# Patient Record
Sex: Male | Born: 1990 | Race: Asian | Hispanic: No | Marital: Single | Smoking: Never smoker
Health system: Southern US, Community
[De-identification: ages and names within clinical notes are randomized; demographics above are authoritative.]

---
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
## 2010-09-13 ENCOUNTER — Encounter: Payer: Self-pay | Admitting: *Deleted

## 2011-01-08 NOTE — H&P (Signed)
NAME:  MERLEN, GURRY NO.:  [id number]   MEDICAL RECORD NO.:  [id number]          PATIENT TYPE:  AMB   LOCATION:  SDS                          FACILITY:  [facility]   PHYSICIAN:  Hilda Lias, M.D.   DATE OF BIRTH:  March 22, 1991   DATE OF ADMISSION:  04/08/2006  DATE OF DISCHARGE:                                HISTORY & PHYSICAL   HISTORY:  Mr. Paolini is a 20 year old gentleman who was referred to me by the  pediatrician, Dr. Georg Ruddle.  He came to me and telling me that when he was  born he noticed a lesion in the second area which had been getting bigger in  the past few years.  He was sent to [place] for evaluation.  We did an MRI of the  lumbar and  sacral area with and without contrast, which was negative.  There was no connection whatsoever between the mass and the lumbar spine or  the sacral area.  Never-the-less, because it is getting bigger and is  uncomfortable for the patient, we bring him in today for surgery.   PAST MEDICAL HISTORY:  He has a history of a heart murmur.   SOCIAL HISTORY:  Negative.   MEDICATIONS:  Levoxyl.   FAMILY HISTORY:  Negative.   PHYSICAL EXAMINATION:  HEENT:  Normal.  NECK:  Normal.  LUNGS:  Clear.  HEART:  Sounds normal.  ABDOMEN:  Normal.  EXTREMITIES:  Normal pulses.  NEUROLOGIC:  Completely normal.  Inspection of the lumbar spine is negative.  BACK:  In the sacrum right in the inter-gluteal area on the right side there  is a large cyst which is not painful.  It is hard.  There are no bony  abnormalities.  There is no hair in that area.  There is no connection  whatsoever with the anus.  There is no opening whatsoever.   The x-ray of the lumbar spine as well as the MRI is negative.   CLINICAL IMPRESSION:  Rule out dermoid cyst or pilonidal cyst.   RECOMMENDATIONS:  The plan is to go sometime tomorrow for further evaluation  or surgery.  The father wants [place] to proceed with surgery.  The procedure  will be a resection of  the lesion.  The risks of recurrence, infection or  the possibility of a CSF leak.  In the procedure we are going to rule out  the possibility of a fistula.          ______________________________  Hilda Lias, M.D.    EB/MEDQ  D:  04/08/2006  T:  04/08/2006  Job:  [id number]

## 2011-01-08 NOTE — Op Note (Signed)
NAME:  Cesar Melendez, Cesar Melendez NO.:  [id number]   MEDICAL RECORD NO.:  [id number]          PATIENT TYPE:  AMB   LOCATION:  SDS                          FACILITY:  [facility]   PHYSICIAN:  Hilda Lias, M.D.   DATE OF BIRTH:  Mar 30, 1991   DATE OF PROCEDURE:  04/08/2006  DATE OF DISCHARGE:                                 OPERATIVE REPORT   SURGEON:  Hilda Lias, M.D.   ASSISTANT:  Cristi Loron, M.D.   PREOPERATIVE DIAGNOSIS:  Sacral mass, rule out lipoma.   POSTOPERATIVE DIAGNOSIS:  Keloid of the sacral area.   PROCEDURE:  Resection of the sacral keloid.   CLINICAL HISTORY:  Mr. Eichelberger is a 20 year old, who has been complaining of a  mass in the intergluteal area for several years, which according to the  family is getting worse lately.  The patient had difficulty sitting.  We did  an MRI of the lumbosacral area and it was negative.  There was no connection  whatsoever between the skin and the spine.  Basically, the mass was solid,  with some erythema on the skin.  There was no evidence of any hair or  drainage in the area.  Because of this findings, the patient and the family  wanted to proceed with resection.   PROCEDURE:  The patient was taken to the operating room and the area was  prepped with DuraPrep.  The mass was about an inch above the anus.  Incision  in the midline was made and dissection was going laterally.  Indeed, what we  found was the mass was approximately 1 x 1 1/2 inches, really hard, attached  to the skin.  Dissection was carried down all the way down to the sacrum.  We investigated the sacrum, and there was no evidence of any fistula.  There  was no connection between the fascia and the tumor itself.  We went  laterally and resected the whole area.  The normal paragluteal fat tissue  was left behind.  At the end, we had a good resection.  The mass was sent to  the laboratory and came back as a keloid.  Finally, the area was irrigated.  Hemostasis was also completed with bipolar.  Then, the area was pulled back  together using 0 Vicryl and 3-0 Vicryl.  There was a good approximation of  the edges.  Steri-Strips were applied to the area.  The patient is going to  go to the PACU, and I will see him later on this afternoon to see if he can  go home.           ______________________________  Hilda Lias, M.D.     EB/MEDQ  D:  04/08/2006  T:  04/08/2006  Job:  [id number]

## 2015-07-30 ENCOUNTER — Ambulatory Visit (INDEPENDENT_AMBULATORY_CARE_PROVIDER_SITE_OTHER): Payer: Managed Care, Other (non HMO) | Admitting: Internal Medicine

## 2015-07-30 ENCOUNTER — Encounter: Payer: Self-pay | Admitting: Internal Medicine

## 2015-07-30 VITALS — BP 102/60 | HR 81 | Temp 98.2°F | Resp 12 | Ht 65.0 in | Wt 141.8 lb

## 2015-07-30 DIAGNOSIS — E0500 Thyrotoxicosis with diffuse goiter without thyrotoxic crisis or storm: Secondary | ICD-10-CM | POA: Insufficient documentation

## 2015-07-30 DIAGNOSIS — R946 Abnormal results of thyroid function studies: Secondary | ICD-10-CM

## 2015-07-30 DIAGNOSIS — R7989 Other specified abnormal findings of blood chemistry: Secondary | ICD-10-CM

## 2015-07-30 DIAGNOSIS — E031 Congenital hypothyroidism without goiter: Secondary | ICD-10-CM

## 2015-07-30 LAB — T3, FREE: T3, Free: 3.7 pg/mL (ref 2.3–4.2)

## 2015-07-30 LAB — T4, FREE: FREE T4: 0.8 ng/dL (ref 0.60–1.60)

## 2015-07-30 LAB — TSH: TSH: 0.04 u[IU]/mL — ABNORMAL LOW (ref 0.35–4.50)

## 2015-07-30 NOTE — Patient Instructions (Signed)
Please stop at the lab.  If the thyroid tests are abnormal, we may need a thyroid uptake and scan.  Please come back for a follow-up appointment in 6 months.  Hyperthyroidism Hyperthyroidism is when the thyroid is too active (overactive). Your thyroid is a large gland that is located in your neck. The thyroid helps to control how your body uses food (metabolism). When your thyroid is overactive, it produces too much of a hormone called thyroxine.  CAUSES Causes of hyperthyroidism may include:  Graves disease. This is when your immune system attacks the thyroid gland. This is the most common cause.  Inflammation of the thyroid gland.  Tumor in the thyroid gland or somewhere else.  Excessive use of thyroid medicines, including:  Prescription thyroid supplement.  Herbal supplements that mimic thyroid hormones.  Solid or fluid-filled lumps within your thyroid gland (thyroid nodules).  Excessive ingestion of iodine. RISK FACTORS  Being male.  Having a family history of thyroid conditions. SIGNS AND SYMPTOMS Signs and symptoms of hyperthyroidism may include:  Nervousness.  Inability to tolerate heat.  Unexplained weight loss.  Diarrhea.  Change in the texture of hair or skin.  Heart skipping beats or making extra beats.  Rapid heart rate.  Loss of menstruation.  Shaky hands.  Fatigue.  Restlessness.  Increased appetite.  Sleep problems.  Enlarged thyroid gland or nodules. DIAGNOSIS  Diagnosis of hyperthyroidism may include:  Medical history and physical exam.  Blood tests.  Ultrasound tests. TREATMENT Treatment may include:  Medicines to control your thyroid.  Surgery to remove your thyroid.  Radiation therapy. HOME CARE INSTRUCTIONS   Take medicines only as directed by your health care provider.  Do not use any tobacco products, including cigarettes, chewing tobacco, or electronic cigarettes. If you need help quitting, ask your health care  provider.  Do not exercise or do physical activity until your health care provider approves.  Keep all follow-up appointments as directed by your health care provider. This is important. SEEK MEDICAL CARE IF:  Your symptoms do not get better with treatment.  You have fever.  You are taking thyroid replacement medicine and you:  Have depression.  Feel mentally and physically slow.  Have weight gain. SEEK IMMEDIATE MEDICAL CARE IF:   You have decreased alertness or a change in your awareness.  You have abdominal pain.  You feel dizzy.  You have a rapid heartbeat.  You have an irregular heartbeat.   This information is not intended to replace advice given to you by your health care provider. Make sure you discuss any questions you have with your health care provider.   Document Released: 08/09/2005 Document Revised: 08/30/2014 Document Reviewed: 12/25/2013 Elsevier Interactive Patient Education [employer]2016 Elsevier Inc.

## 2015-07-30 NOTE — Progress Notes (Addendum)
Patient ID: Cesar Melendez, male   DOB: April 21, 1991, 24 y.o.   MRN: [id number]   HPI  Cesar Melendez is a 24 y.o.-year-old male, referred by his PCP, Dr. Leodis Sias, for management of hypothyroidism, with recent tests showing hyperthyroidism even after he stopped thyroid hormone supplementation. In the past, he saw his pediatrician, Dr Samuel Bouche, who retired since. He is here accompanied by his father, who provides part of the hx.  Pt. has been dx with hypothyroidism at birth;  She was started on levothyroxine >> 50 and 75 mcg daily, however recently, his TSH started to decrease this by decreasing the dose of levothyroxine. His levothyroxine was stopped completely in 03/2015.  I reviewed pt's thyroid tests: 06/27/2015: TSH ? (records pending), TT4 10.1 (4.5-12), T3 uptake 31 (24-39%), fTI 3.1 (1.2-4.9) 04/22/2015: TSH <0.01 10/04/2014:  TSH 0.02 04/03/2014: TSH 0.56 02/16/2013: TSH <0.01  (0.34-4.5)  Of note, patient had an MRI of the brain with and without contrast on 04/02/2009, that returned normal.  Pt denies: - weight gain/loss - palpitations - tremors - insomnia - SOB - fatigue - heat/cold intolerance - anxiety/depression - diarrhea/constipation - moist/dry skin - hair loss  Pt denies feeling nodules in neck, hoarseness, dysphagia/odynophagia, SOB with lying down.  She has no FH of thyroid disorders No FH of thyroid cancer.  No h/o radiation tx to head or neck. No recent use of iodine supplements. No steroids. No Biotin.   Pt does not have other PMH.  ROS: Constitutional: no weight gain/loss, no fatigue, no subjective hyperthermia/hypothermia Eyes: no blurry vision, no xerophthalmia ENT: no sore throat, no nodules palpated in throat, no dysphagia/odynophagia, no hoarseness Cardiovascular: no CP/SOB/palpitations/leg swelling Respiratory: no cough/SOB Gastrointestinal: no N/V/D/C Musculoskeletal: no muscle/joint aches Skin: no rashes Neurological: no  tremors/numbness/tingling/dizziness Psychiatric: no depression/anxiety  No past medical history other than hypothyroidism. He had ear tubes as a child. Has a h/o pilonidal cyst.  No past surgical history.   Social History   Social History  . Marital Status: Single    Spouse Name: N/A  . Number of Children: 0   Occupational History  . student   Social History Main Topics  . Smoking status: Never Smoker   . Smokeless tobacco: Not on file  . Alcohol Use: No  . Drug Use: No   No current outpatient prescriptions on file.  No Known Allergies   FH:  - DM in [place], GF - HTN in [place], GF, father  PE: BP 102/60 mmHg  Pulse 81  Temp(Src) 98.2 F (36.8 C) (Oral)  Resp 12  Ht  (1.651 m)  Wt 141 lb 12.8 oz (64.32 kg)  BMI 23.60 kg/m2  SpO2 97% Wt Readings from Last 3 Encounters:  07/30/15 141 lb 12.8 oz (64.32 kg)  05/12/09 123 lb 6.4 oz (55.974 kg) (8 %*, Z = -1.39)  04/17/09 122 lb (55.339 kg) (7 %*, Z = -1.46)   * Growth percentiles are based on CDC 2-20 Years data.   Constitutional: normal weight, in NAD Eyes: PERRLA, EOMI, no exophthalmos ENT: moist mucous membranes, no thyromegaly, no cervical lymphadenopathy Cardiovascular: RRR, No MRG Respiratory: CTA B Gastrointestinal: abdomen soft, NT, ND, BS+ Musculoskeletal: no deformities, strength intact in all 4 Skin: moist, warm, no rashes Neurological: no tremor with outstretched hands, DTR normal in all 4  ASSESSMENT: 1. H/o congenital Hypothyroidism  2. Recently, suppressed TSH  PLAN:  1. And 2. Patient with congenital hypothyroidism, on levothyroxine therapy for a long time (50-75 mcg daily) until  2014 when his TSH started to become suppressed. His LT4 dose was gradually decreased until it was stopped in 03/2015.  After stopping the thyroid hormones, he had a repeat set of thyroid tests by PCP, but I only have a total T4,  FTI and T3 uptake that were normal, but no associated TSH... - My suspicion is that his  hypothyroidism resolved and he may not require thyroid hormones as an adult. This is a possible scenario, as some causes of congenital or childhood hypothyroidism may be temporary. - He appears euthyroid, and does not relate past hyperthyroid sxs. (weight loss, heat intolerance, hyperdefecation, palpitations, anxiety).  - no goiter, thyroid nodules, or neck compression symptoms. His thyroid is not enlarged on palpation. - he does not appear to have exogenous causes for the low TSH.  - We discussed that possible causes of thyrotoxicosis are:  Graves ds   Thyroiditis toxic multinodular goiter/ toxic adenoma (I cannot feel nodules at palpation of his thyroid). - I suggested that we check the TSH, fT3 and fT4 and also add thyroid stimulating antibodies to screen for Graves' disease.  - If the tests remain abnormal, we may need an uptake and scan to differentiate between the 3 above possible etiologies  - we discussed about possible modalities of treatment for the above conditions, to include methimazole use, radioactive iodine ablation or (last resort) surgery. - I do not feel that we need to add beta blockers at this time, since he is not tachycardic, anxious, or tremulous - I advised him to join my chart to communicate easier - Return in about 6 months (around 01/28/2016).   Component     Latest Ref Rng 07/30/2015  T3, Free     2.3 - 4.2 pg/mL 3.7  Free T4     0.60 - 1.60 ng/dL 4.090.80  TSH     8.110.35 - 9.144.50 uIU/mL 0.04 (L)  TSI     <140 % baseline 69  TSH still suppressed, without elevated TSI >> will check a thyroid Uptake and scan. No tx needed for now since asymptomatic. I will addend the results when they become available.  CLINICAL DATA: Hyperthyroidism. Decreased TSH  EXAM: THYROID SCAN AND UPTAKE - 24 HOURS  TECHNIQUE: Following the per oral administration of I-131 sodium iodide, the patient returned at 24 hours and uptake measurements were acquired with the uptake probe centered  on the neck. Thyroid imaging was performed following the intravenous administration of the Tc-7269m Pertechnetate.  RADIOPHARMACEUTICALS: 12.7 microCuries I-131 sodium iodide orally and 10.0 mCi Technetium-5369m pertechnetate IV  COMPARISON: None  FINDINGS: The 24 hour radioactive iodine uptake is equal to 30.5%.  The thyroid scan is normal. There is uniform tracer uptake within both lobes of the thyroid gland  IMPRESSION:  1. The 24 hour radioactive iodine uptake is upper limits of normal equal to 30.5%. There is uniform tracer uptake throughout both lobes of the thyroid gland. In the setting of hyperthyroidism findings may reflect subclinical hyperthyroidism or early Grave's disease.  Electronically Signed By: Signa Kellaylor Stroud M.D. On: 08/13/2015 16:54  Mild Graves' disease.  We'll continue to monitor without treatment, since he is asymptomatic. Will repeat thyroid tests in 2-3 months.

## 2015-08-06 LAB — THYROID STIMULATING IMMUNOGLOBULIN: TSI: 69 % baseline (ref <140)

## 2015-08-07 ENCOUNTER — Encounter: Payer: Self-pay | Admitting: Internal Medicine

## 2015-08-12 ENCOUNTER — Ambulatory Visit (HOSPITAL_COMMUNITY)
Admission: RE | Admit: 2015-08-12 | Discharge: 2015-08-12 | Disposition: A | Discharge status: Home / Self Care | Payer: Managed Care, Other (non HMO) | Source: Ambulatory Visit | Attending: Internal Medicine | Admitting: Internal Medicine

## 2015-08-12 ENCOUNTER — Encounter (HOSPITAL_COMMUNITY): Admission: RE | Admit: 2015-08-12 | Payer: Managed Care, Other (non HMO) | Source: Ambulatory Visit

## 2015-08-12 DIAGNOSIS — R946 Abnormal results of thyroid function studies: Secondary | ICD-10-CM | POA: Insufficient documentation

## 2015-08-12 MED ORDER — SODIUM IODIDE I 131 CAPSULE
12.2000 | Freq: Once | INTRAVENOUS | Status: AC | PRN
  Administered 2015-08-12: 12.2 via ORAL

## 2015-08-13 ENCOUNTER — Encounter (HOSPITAL_COMMUNITY)
Admission: RE | Admit: 2015-08-13 | Discharge: 2015-08-13 | Disposition: A | Discharge status: Home / Self Care | Payer: Managed Care, Other (non HMO) | Source: Ambulatory Visit | Attending: Internal Medicine | Admitting: Internal Medicine

## 2015-08-13 ENCOUNTER — Encounter (HOSPITAL_COMMUNITY): Payer: Managed Care, Other (non HMO)

## 2015-08-13 DIAGNOSIS — R946 Abnormal results of thyroid function studies: Secondary | ICD-10-CM | POA: Diagnosis present

## 2015-08-13 MED ORDER — SODIUM PERTECHNETATE TC 99M INJECTION
10.0000 | Freq: Once | INTRAVENOUS | Status: AC | PRN
  Administered 2015-08-13: 10 via INTRAVENOUS

## 2015-08-14 NOTE — Addendum Note (Signed)
Addended by: Carlus PavlovGHERGHE, Pranshu Lyster on: 08/14/2015 06:00 PM   Modules accepted: Orders

## 2016-01-28 ENCOUNTER — Ambulatory Visit (INDEPENDENT_AMBULATORY_CARE_PROVIDER_SITE_OTHER): Payer: Managed Care, Other (non HMO) | Admitting: Internal Medicine

## 2016-01-28 ENCOUNTER — Encounter: Payer: Self-pay | Admitting: Internal Medicine

## 2016-01-28 VITALS — BP 118/70 | HR 79 | Temp 97.9°F | Resp 12 | Wt 145.0 lb

## 2016-01-28 DIAGNOSIS — E031 Congenital hypothyroidism without goiter: Secondary | ICD-10-CM

## 2016-01-28 DIAGNOSIS — E0500 Thyrotoxicosis with diffuse goiter without thyrotoxic crisis or storm: Secondary | ICD-10-CM | POA: Diagnosis not present

## 2016-01-28 LAB — T4, FREE: FREE T4: 0.85 ng/dL (ref 0.60–1.60)

## 2016-01-28 LAB — T3, FREE: T3 FREE: 3.5 pg/mL (ref 2.3–4.2)

## 2016-01-28 LAB — TSH: TSH: 1.32 u[IU]/mL (ref 0.35–4.50)

## 2016-01-28 NOTE — Progress Notes (Signed)
Patient ID: Cesar Melendez Tapia, male   DOB: 07-Aug-1991, 25 y.o.   MRN: [id number]   HPI  Cesar Melendez Cesar Melendez is a 25 y.o.-year-old male, initially referred by his PCP, Dr. Doreen SalvageFrancis Wong,returning for f/u for recent dx of mild Graves ds., previous h/o congenital hypothyroidism - now off thyroid hormone supplementation. Last visit 6 months ago.  Reviewed and addended hx: Pt. has been dx with hypothyroidism at birth;  She was started on levothyroxine >> 50 and 75 mcg daily, however recently, his TSH started to decrease this by decreasing the dose of levothyroxine. His levothyroxine was stopped completely in 03/2015.  I reviewed pt's thyroid tests: Component     Latest Ref Rng 07/30/2015  T3, Free     2.3 - 4.2 pg/mL 3.7  Free T4     0.60 - 1.60 ng/dL 6.960.80  TSH     2.950.35 - 2.844.50 uIU/mL 0.04 (L)  TSI     <140 % baseline 69  TSH still suppressed, without elevated TSI.  Previous TFTs: 06/27/2015: TSH ? (records pending), TT4 10.1 (4.5-12), T3 uptake 31 (24-39%), fTI 3.1 (1.2-4.9) 04/22/2015: TSH <0.01 10/04/2014:  TSH 0.02 04/03/2014: TSH 0.56 02/16/2013: TSH <0.01  (0.34-4.5)  08/13/2015: Thyroid Uptake and scan: The 24 hour radioactive iodine uptake is upper limits of normal equal to 30.5%. There is uniform tracer uptake throughout both lobes of the thyroid gland. In the setting of hyperthyroidism findings may reflect subclinical hyperthyroidism or early Graves disease.  Since he was asymptomatic and he had only subclinical hyperthyroidism >> we did not start any tx.  Of note, patient had an MRI of the brain with and without contrast on 04/02/2009, that returned normal.  Pt denies: - weight gain/loss - palpitations - tremors - insomnia - SOB - fatigue - heat/cold intolerance - diarrhea/constipation - moist/dry skin - hair loss  No increased anxiety.  Pt denies feeling nodules in neck, hoarseness, dysphagia/odynophagia, SOB with lying down.  She has no FH of thyroid disorders No FH of thyroid  cancer.  No h/o radiation tx to head or neck. No recent use of iodine supplements. No steroids. No Biotin.   Pt does not have other PMH.  ROS: Constitutional: no weight gain/loss, no fatigue, no subjective hyperthermia/hypothermia Eyes: no blurry vision, no xerophthalmia ENT: no sore throat, no nodules palpated in throat, no dysphagia/odynophagia, no hoarseness Cardiovascular: no CP/SOB/palpitations/leg swelling Respiratory: no cough/SOB Gastrointestinal: no N/V/D/C Musculoskeletal: no muscle/joint aches Skin: no rashes Neurological: no tremors/numbness/tingling/dizziness  No past medical history other than hypothyroidism. He had ear tubes as a child. Has a h/o pilonidal cyst.  No past surgical history.   Social History   Social History  . Marital Status: Single    Spouse Name: N/A  . Number of Children: 0   Occupational History  . student   Social History Main Topics  . Smoking status: Never Smoker   . Smokeless tobacco: Not on file  . Alcohol Use: No  . Drug Use: No   No current outpatient prescriptions on file.  No Known Allergies   FH:  - DM in [place], GF - HTN in [place], GF, father  PE: BP 118/70 mmHg  Pulse 79  Temp(Src) 97.9 F (36.6 C) (Oral)  Resp 12  Wt 145 lb (65.772 kg)  SpO2 97% Wt Readings from Last 3 Encounters:  01/28/16 145 lb (65.772 kg)  07/30/15 141 lb 12.8 oz (64.32 kg)  05/12/09 123 lb 6.4 oz (55.974 kg) (8 %*, Z = -1.39)   *  Growth percentiles are based on CDC 2-20 Years data.   Constitutional: normal weight, in NAD Eyes: PERRLA, EOMI, no exophthalmos ENT: moist mucous membranes, no thyromegaly, no cervical lymphadenopathy Cardiovascular: RRR, No MRG Respiratory: CTA B Gastrointestinal: abdomen soft, NT, ND, BS+ Musculoskeletal: no deformities, strength intact in all 4 Skin: moist, warm, no rashes Neurological: no tremor with outstretched hands, DTR normal in all 4  ASSESSMENT: 1. H/o congenital Hypothyroidism  2. Mild Graves ds.    PLAN:  1. And 2. Patient with congenital hypothyroidism, on levothyroxine therapy for a long time (50-75 mcg daily) until 2014 when his TSH started to become suppressed. His LT4 dose was gradually decreased until it was stopped in 03/2015.  After stopping the thyroid hormones, he had a repeat set of thyroid tests by PCP, but I only have a total T4,  FTI and T3 uptake that were normal, but no associated TSH... Recheck TFTs at last visit, and his TSH was suppressed, but detectable, while the free T4, free T3, and TSI antibodies were normal. We obtained a thyroid uptake and scan and this showed mild Graves' disease, with a very slightly elevated uptake of 30.5% (10-30%).  - At this visit, I discussed with the patient and his father that his abnormal TFTs could have been from his mild Graves' disease or from mild persistent congenital hypothyroidism. Checking labs over time with differentiate between the 2. - At this visit, he again appears euthyroid, and does not relate past hyperthyroid sxs. (weight loss, heat intolerance, hyperdefecation, palpitations, anxiety).  - no goiter, thyroid nodules, or neck compression symptoms. His thyroid is not enlarged on palpation. - We will re-check the TSH, fT3 and fT4 today - No treatment needed for now - Return in about 6 months (around 07/29/2016).  Component     Latest Ref Rng 01/28/2016  Triiodothyronine,Free,Serum     2.3 - 4.2 pg/mL 3.5  T4,Free(Direct)     0.60 - 1.60 ng/dL 5.78  TSH     4.69 - 6.29 uIU/mL 1.32  Surprisingly, although TFTs are now normal. We'll repeat them in 3 months.

## 2016-01-28 NOTE — Patient Instructions (Signed)
Please stop at the lab.  Please come back for a follow-up appointment in 6 months.  

## 2016-03-02 ENCOUNTER — Other Ambulatory Visit: Payer: Self-pay | Admitting: Urology

## 2016-03-02 DIAGNOSIS — R361 Hematospermia: Secondary | ICD-10-CM

## 2016-03-06 ENCOUNTER — Ambulatory Visit (HOSPITAL_COMMUNITY)
Admission: RE | Admit: 2016-03-06 | Discharge: 2016-03-06 | Disposition: A | Discharge status: Home / Self Care | Payer: Managed Care, Other (non HMO) | Source: Ambulatory Visit | Attending: Urology | Admitting: Urology

## 2016-03-06 DIAGNOSIS — N368 Other specified disorders of urethra: Secondary | ICD-10-CM | POA: Diagnosis not present

## 2016-03-06 DIAGNOSIS — R361 Hematospermia: Secondary | ICD-10-CM | POA: Diagnosis present

## 2016-03-06 MED ORDER — GADOBENATE DIMEGLUMINE 529 MG/ML IV SOLN
13.0000 mL | Freq: Once | INTRAVENOUS | Status: AC | PRN
  Administered 2016-03-06: 13 mL via INTRAVENOUS

## 2016-04-29 ENCOUNTER — Other Ambulatory Visit (INDEPENDENT_AMBULATORY_CARE_PROVIDER_SITE_OTHER): Payer: Managed Care, Other (non HMO)

## 2016-04-29 DIAGNOSIS — E0500 Thyrotoxicosis with diffuse goiter without thyrotoxic crisis or storm: Secondary | ICD-10-CM | POA: Diagnosis not present

## 2016-04-29 LAB — T3, FREE: T3, Free: 3 pg/mL (ref 2.3–4.2)

## 2016-04-29 LAB — T4, FREE: Free T4: 1.02 ng/dL (ref 0.60–1.60)

## 2016-04-29 LAB — TSH: TSH: 1.47 u[IU]/mL (ref 0.35–4.50)

## 2016-07-29 ENCOUNTER — Encounter: Payer: Self-pay | Admitting: Internal Medicine

## 2016-07-29 ENCOUNTER — Ambulatory Visit (INDEPENDENT_AMBULATORY_CARE_PROVIDER_SITE_OTHER): Payer: Managed Care, Other (non HMO) | Admitting: Internal Medicine

## 2016-07-29 VITALS — BP 137/90 | HR 69 | Wt 148.0 lb

## 2016-07-29 DIAGNOSIS — E0500 Thyrotoxicosis with diffuse goiter without thyrotoxic crisis or storm: Secondary | ICD-10-CM | POA: Diagnosis not present

## 2016-07-29 DIAGNOSIS — E031 Congenital hypothyroidism without goiter: Secondary | ICD-10-CM

## 2016-07-29 LAB — T3, FREE: T3 FREE: 3.5 pg/mL (ref 2.3–4.2)

## 2016-07-29 LAB — TSH: TSH: 1.58 u[IU]/mL (ref 0.35–4.50)

## 2016-07-29 LAB — T4, FREE: FREE T4: 0.84 ng/dL (ref 0.60–1.60)

## 2016-07-29 NOTE — Progress Notes (Signed)
Patient ID: Cesar CossHenry Melendez, male   DOB: [birth year], 25 y.o.   MRN: [id number]   HPI  Cesar Melendez is a 25 y.o.-year-old male, initially referred by his PCP, Dr. Doreen SalvageFrancis Wong,returning for f/u for recent dx of mild Graves ds., previous h/o congenital hypothyroidism - now off thyroid hormone supplementation. Last visit 6 months ago.   He also was dx'ed with hematuria >> sees urology.  Reviewed and addended hx: Pt. has been dx with hypothyroidism at birth;  She was started on levothyroxine >> 50 and 75 mcg daily, however recently, his TSH started to decrease this by decreasing the dose of levothyroxine. His levothyroxine was stopped completely in 03/2015.  I reviewed pt's thyroid tests: Lab Results  Component Value Date   TSH 1.47 04/29/2016   TSH 1.32 01/28/2016   TSH 0.04 (L) 07/30/2015   FREET4 1.02 04/29/2016   FREET4 0.85 01/28/2016   FREET4 0.80 07/30/2015   Component     Latest Ref Rng 07/30/2015  T3, Free     2.3 - 4.2 pg/mL 3.7  Free T4     0.60 - 1.60 ng/dL 4.400.80  TSH     1.020.35 - 7.254.50 uIU/mL 0.04 (L)  TSI     <140 % baseline 69   Previous TFTs: 06/27/2015: TSH ? (records pending), TT4 10.1 (4.5-12), T3 uptake 31 (24-39%), fTI 3.1 (1.2-4.9) 04/22/2015: TSH <0.01 10/04/2014:  TSH 0.02 04/03/2014: TSH 0.56 02/16/2013: TSH <0.01  (0.34-4.5)  08/13/2015: Thyroid Uptake and scan: The 24 hour radioactive iodine uptake is upper limits of normal equal to 30.5%. There is uniform tracer uptake throughout both lobes of the thyroid gland. In the setting of hyperthyroidism findings may reflect subclinical hyperthyroidism or early Graves disease.  Since he was asymptomatic and he had only subclinical hyperthyroidism >> we did not start any tx. Subsequent labs have normalized.  Of note, patient had an MRI of the brain with and without contrast on 04/02/2009, that returned normal.  Pt denies: - weight gain/loss - palpitations - tremors - insomnia - SOB - fatigue - heat/cold  intolerance - diarrhea/constipation - moist/dry skin - hair loss  No increased anxiety. He has insomnia - chronic.  Pt denies feeling nodules in neck, hoarseness, dysphagia/odynophagia, SOB with lying down.  He has no FH of thyroid disorders No FH of thyroid cancer. No h/o radiation tx to head or neck.nNo recent use of iodine supplements. No steroids. No Biotin.   Pt does not have other PMH.  ROS: Constitutional: no weight gain/loss, no fatigue, no subjective hyperthermia/hypothermia Eyes: no blurry vision, no xerophthalmia ENT: no sore throat, no nodules palpated in throat, no dysphagia/odynophagia, no hoarseness Cardiovascular: no CP/SOB/palpitations/leg swelling Respiratory: no cough/SOB Gastrointestinal: no N/V/D/C Musculoskeletal: no muscle/joint aches Skin: no rashes Neurological: no tremors/numbness/tingling/dizziness  No past medical history other than hypothyroidism. He had ear tubes as a child. Has a h/o pilonidal cyst.  No past surgical history.   Social History   Social History  . Marital Status: Single    Spouse Name: N/A  . Number of Children: 0   Occupational History  . student   Social History Main Topics  . Smoking status: Never Smoker   . Smokeless tobacco: Not on file  . Alcohol Use: No  . Drug Use: No   No Known Allergies   FH:  - DM in [place], GF - HTN in [place], GF, father  PE: BP 137/90   Pulse 69   Wt 148 lb (67.1 kg)   BMI 24.63  kg/m  Wt Readings from Last 3 Encounters:  07/29/16 148 lb (67.1 kg)  01/28/16 145 lb (65.8 kg)  07/30/15 141 lb 12.8 oz (64.3 kg)   Constitutional: normal weight, in NAD Eyes: PERRLA, EOMI, no exophthalmos ENT: moist mucous membranes, no thyromegaly, no cervical lymphadenopathy Cardiovascular: RRR, No MRG Respiratory: CTA B Gastrointestinal: abdomen soft, NT, ND, BS+ Musculoskeletal: no deformities, strength intact in all 4 Skin: moist, warm, no rashes Neurological: no tremor with outstretched hands, DTR  normal in all 4  ASSESSMENT: 1. H/o congenital Hypothyroidism  2. Mild Graves ds.   PLAN:  1. And 2. Patient with congenital hypothyroidism, prev. on levothyroxine therapy for a long time (50-75 mcg daily) until 2014 when his TSH started to become suppressed. His LT4 dose was gradually decreased until it was stopped in 03/2015.  After stopping the thyroid hormones, TSH was suppressed, but detectable, while the free T4, free T3, and TSI antibodies were normal. We obtained a thyroid uptake and scan and this showed mild Graves' disease, with a very slightly elevated uptake of 30.5% (10-30%). We continued to just follow the labs >> they normalized. Pt remains asymptomatic. - no goiter, thyroid nodules, or neck compression symptoms. His thyroid is not enlarged on palpation. - We will re-check the TSH, fT3 and fT4 today - No treatment needed for now - we discussed to start having annual labs by PCP and only return if abnormal, but he would like to return here for labs as he does not have other med pbs and does not see PCP on an annual basis - Return in about 1 year (around 07/29/2017).   Office Visit on 07/29/2016  Component Date Value Ref Range Status  . TSH 07/29/2016 1.58  0.35 - 4.50 uIU/mL Final  . Free T4 07/29/2016 0.84  0.60 - 1.60 ng/dL Final   Comment: Specimens from patients who are undergoing biotin therapy and /or ingesting biotin supplements may contain high levels of biotin.  The higher biotin concentration in these specimens interferes with this Free T4 assay.  Specimens that contain high levels  of biotin may cause false high results for this Free T4 assay.  Please interpret results in light of the total clinical presentation of the patient.    . T3, Free 07/29/2016 3.5  2.3 - 4.2 pg/mL Final   Thyroid tests remain normal.  Carlus Pavlovristina Branko Steeves, MD PhD [facility]

## 2016-07-29 NOTE — Patient Instructions (Addendum)
Please stop at the lab.  Please return in 1 year.  

## 2017-07-29 ENCOUNTER — Ambulatory Visit: Payer: Managed Care, Other (non HMO) | Admitting: Internal Medicine

## 2018-03-21 IMAGING — MR MR PELVIS WO/W CM
4 of 8 series · 18 of 48 positions shown · IV contrast (multihance)
Comparison: CT scan from 04/17/2013.

CLINICAL DATA: Hematuria and hematospermia after bowel movements.

EXAM:
MRI PELVIS WITHOUT AND WITH CONTRAST
TECHNIQUE: Multiplanar multisequence MR imaging of the pelvis was performed
both before and after administration of intravenous contrast.
CONTRAST:  13mL MULTIHANCE GADOBENATE DIMEGLUMINE 529 MG/ML IV SOLN

[Series 3: T2 · coronal · 5.0mm · 0.74mm/px · 7 of 35 slices shown (1 of 3)]
[im 1/35]
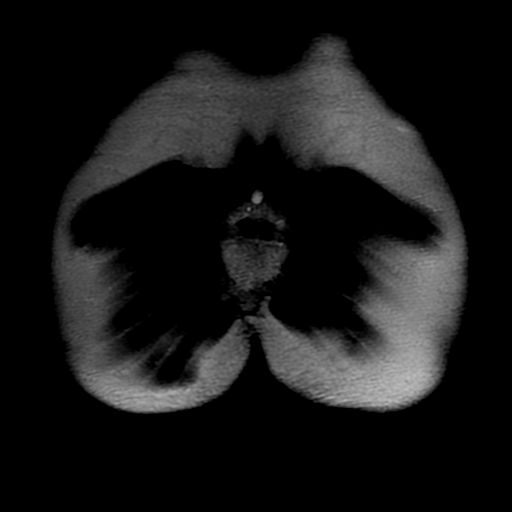
[im 6/35]
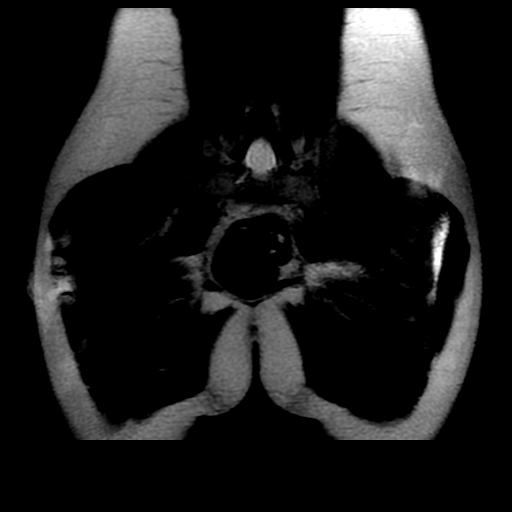
[im 12/35]
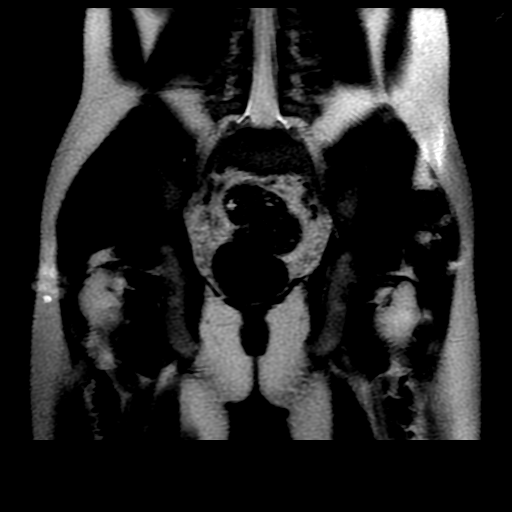
[im 18/35]
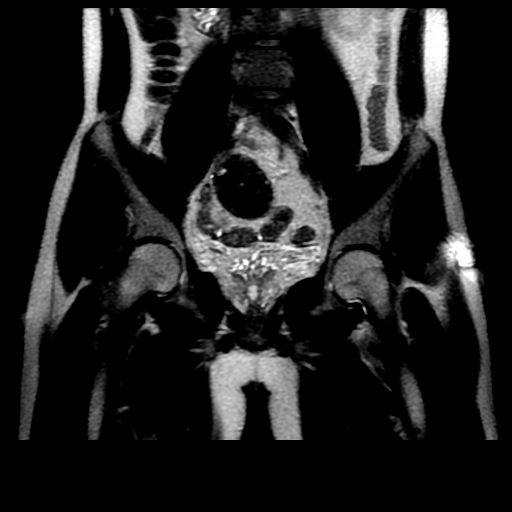
[im 23/35]
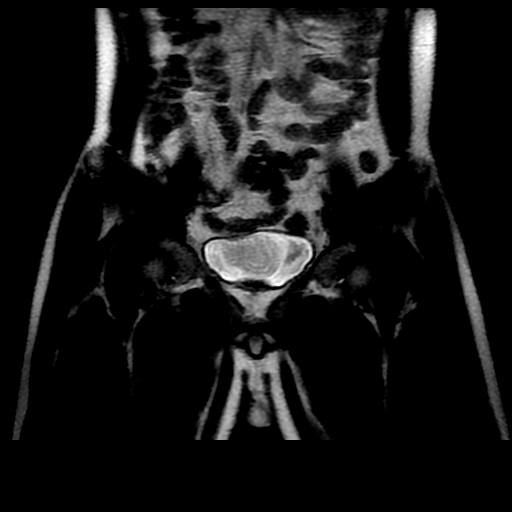
[im 29/35]
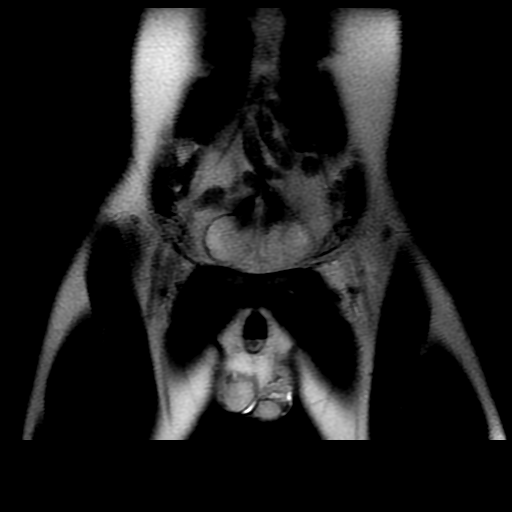
[im 35/35]
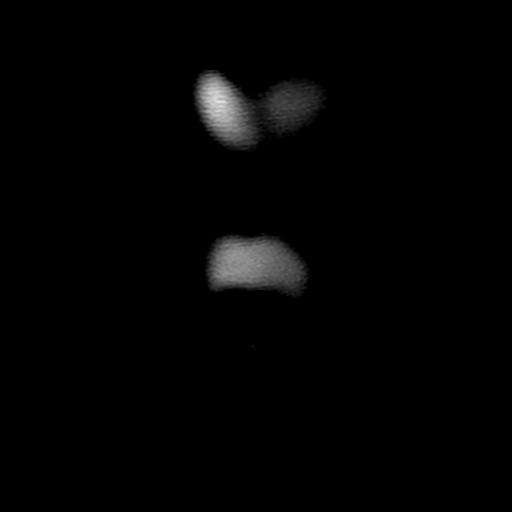

[Series 6: T2 · axial · 5.0mm · 0.47mm/px · z∈[-32,+166]mm · 5 of 34 slices shown (2 of 3)]
[im 1/34]
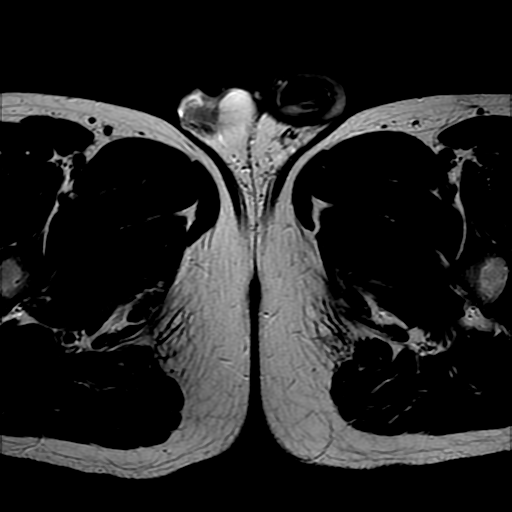
[im 7/34]
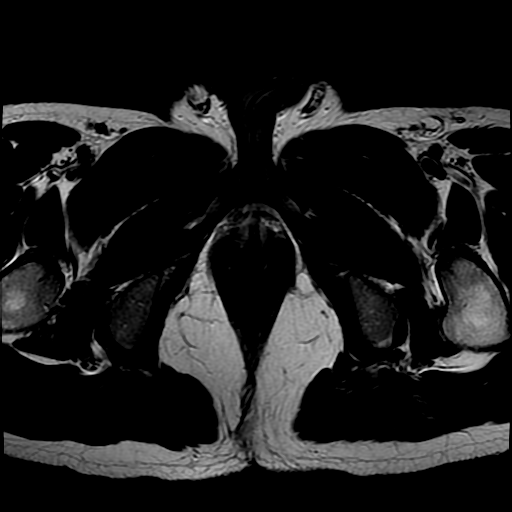
[im 14/34]
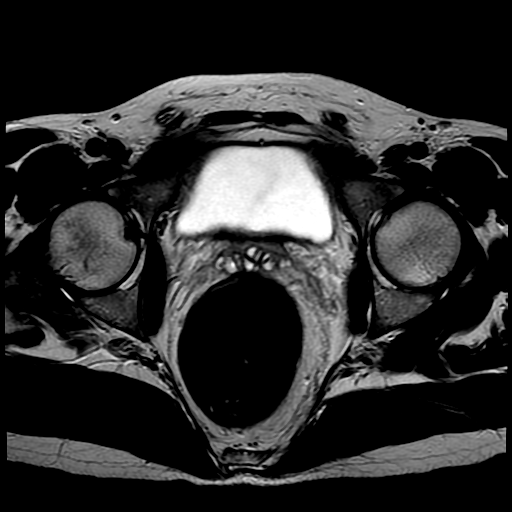
[im 20/34]
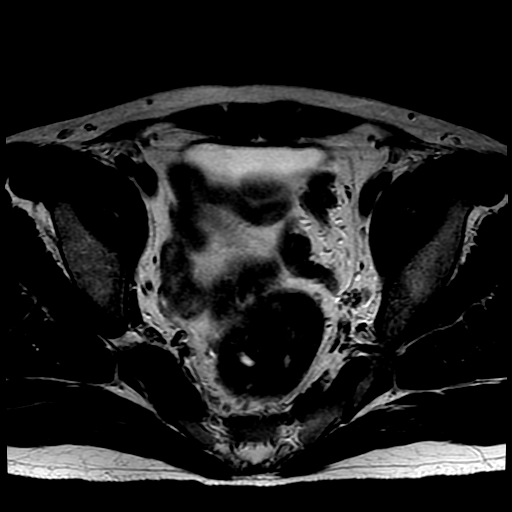
[im 34/34]
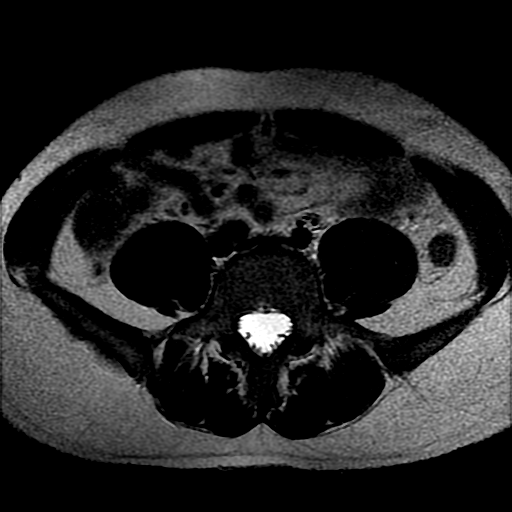

[Series 7: T2 fat-sat · axial · 5.0mm · 0.47mm/px · z∈[+4,+166]mm · 3 of 34 slices shown]
[im 7/34]
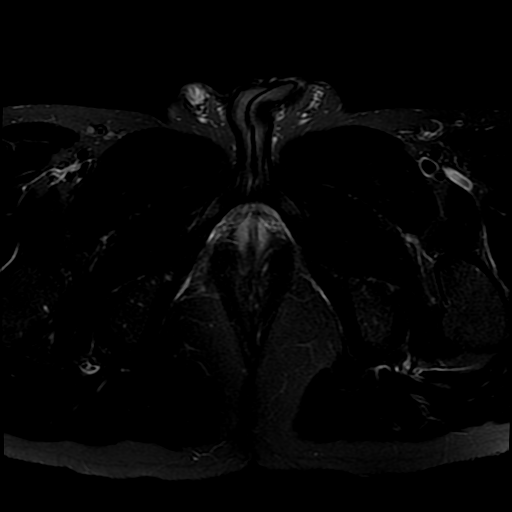
[im 20/34]
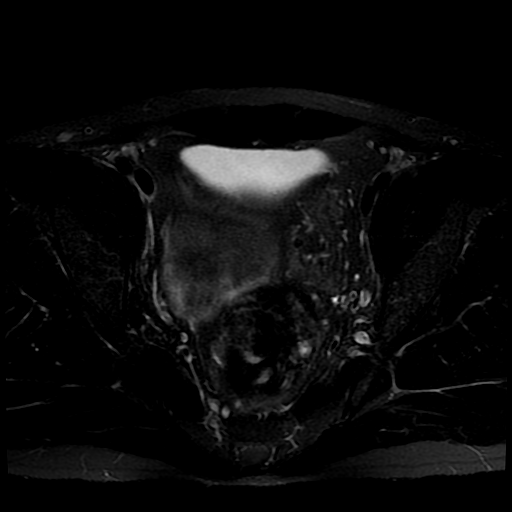
[im 34/34]
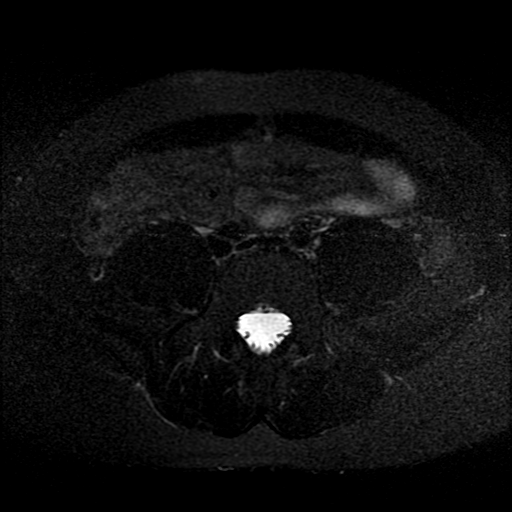

[Series 8: T2 · sagittal · 5.0mm · 0.47mm/px · 3 of 30 slices shown (3 of 3)]
[im 1/30]
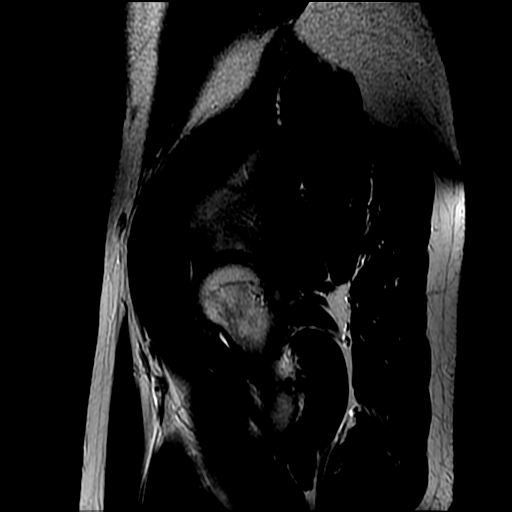
[im 15/30]
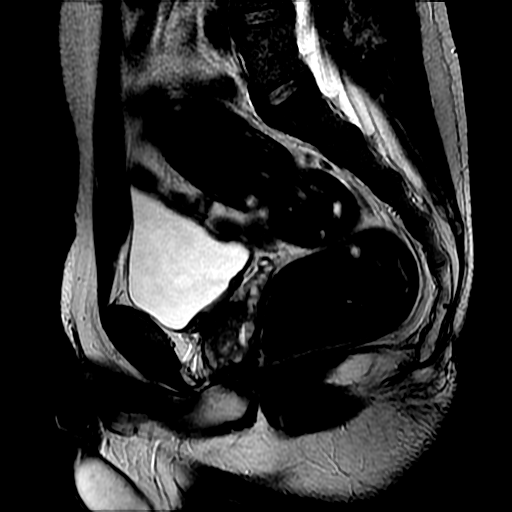
[im 30/30]
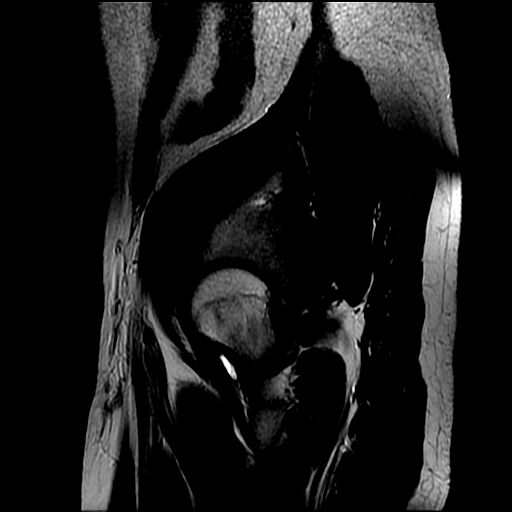

[18 of 48 positions shown; findings below may reference images not displayed]

FINDINGS: No pelvic sidewall lymphadenopathy. No free fluid in the pelvis.
Large volume stool noted in the rectum which measures 6.0 x 6.7 cm
in transverse orthogonal diameters.

Bladder is unremarkable without bladder wall thickening or
nodularity. In the parenchyma of the posterior mid prostate gland,
but closer to the apex, a 12 x 8 x 7 mm cystic structure is
identified. This has a fluid fluid or fluid debris level within it.
Comparing back to the CT scan from 3 years ago, a low-attenuation
structure with multiple associated tiny calcifications was
identified at the same location. This structure is directly in the
midline and immediately posterior to the prostatic urethra. It does
not extend above the prostate gland and is not deviated into a
paramidline location. After IV contrast administration, there is a
thin rim of enhancement around the structure. Other tiny areas of
focal hypo enhancement are evident at the apex of the prostate
gland.

The seminal vesicles are symmetric in size and shape. Homogeneous T2
hyperintensity is seen throughout each seminal vesicle, as expected.
There is no abnormal signal within the seminal vesicles.

Although suboptimally positioned, no gross abnormality of the penis
is evident by MRI.

No abnormal marrow signal identified within the visualized bony
anatomy.
IMPRESSION: 1. Cystic 12 x 8 x 7 mm structure identified immediately posterior
to the lower prostatic urethra. The structure has a fluid-fluid or
fluid-debris layer and based on the previous CT also likely some
dystrophic calcification and/or tiny stones. Given the absolute
midline location and size of this lesion, utricle cyst is favored by
imaging. Mullerian duct cyst would be another consideration although
these can be para midline, extend above the prostate gland and are
characteristically larger. Cystic change in the prostate gland from
infection/inflammation/abscess cannot be excluded but would likely
have clinical signs/symptoms and given that the lesion was evident
on the CT scan from 3 years ago would be more chronic in nature.
2. Other scattered very tiny (2-4 mm) foci of hypo enhancement
identified in the prostate parenchyma towards the apex. These are
nonspecific but could be the sequelae of infection/inflammation.
